# Patient Record
Sex: Female | Born: 1990 | Hispanic: Yes | Marital: Single | Smoking: Never smoker
Health system: Western US, Academic
[De-identification: ages and names within clinical notes are randomized; demographics above are authoritative.]

---

## 2018-06-16 ENCOUNTER — Ambulatory Visit: Payer: BC Managed Care – PPO | Attending: Internal Medicine | Admitting: Nurse Practitioner

## 2018-06-16 VITALS — BP 142/85 | HR 70 | Temp 98.1°F | Resp 16 | Ht 65.0 in | Wt 238.4 lb

## 2018-06-16 DIAGNOSIS — H0011 Chalazion right upper eyelid: Principal | ICD-10-CM | POA: Insufficient documentation

## 2018-06-16 DIAGNOSIS — H0014 Chalazion left upper eyelid: Secondary | ICD-10-CM | POA: Insufficient documentation

## 2018-06-16 DIAGNOSIS — Z6839 Body mass index (BMI) 39.0-39.9, adult: Secondary | ICD-10-CM | POA: Insufficient documentation

## 2018-06-16 MED ORDER — ERYTHROMYCIN 5 MG/GM OP OINT
1.00 | TOPICAL_OINTMENT | Freq: Once | OPHTHALMIC | 0 refills | Status: AC
Start: 2018-06-16 — End: 2018-06-16

## 2018-06-16 NOTE — Patient Instructions (Addendum)
Chalazion of RIGHT upper eyelid  -     erythromycin (ROMYCIN) 5 MG/GM ophthalmic ointment; Place 1 strip into right eye once for 1 dose x 7 days  - Avoid eye makeup, contact lenses use, or swimming  - Warm compress 3 times daily  -   Take medication as directed.   Follow up with PCP in 1  for unimproved symptoms.       Chalazion  A chalazion is a swelling or lump on the eyelid. It can affect the upper or lower eyelid.  What are the causes?  This condition may be caused by:   Long-lasting (chronic) inflammation of the eyelid glands.   A blocked oil gland in the eyelid.    What are the signs or symptoms?  Symptoms of this condition include:   A swelling on the eyelid. The swelling may spread to areas around the eye.   A hard lump on the eyelid. This lump may make it hard to see out of the eye.    How is this diagnosed?  This condition is diagnosed with an examination of the eye.  How is this treated?  This condition is treated by applying a warm compress to the eyelid. If the condition does not improve after two days, it may be treated with:   Surgery.   Medicine that is injected into the chalazion by a health care provider.   Medicine that is applied to the eye.    Follow these instructions at home:   Do not touch the chalazion.   Do not try to remove the pus, such as by squeezing the chalazion or sticking it with a pin or needle.   Do not rub your eyes.   Wash your hands often. Dry your hands with a clean towel.   Keep your face, scalp, and eyebrows clean.   Avoid wearing eye makeup.   Apply a warm, moist compress to the eyelid 4-6 times a day for 10-15 minutes at a time. This will help to open any blocked glands and help to reduce redness and swelling.   Apply over-the-counter and prescription medicines only as told by your health care provider.   If the chalazion does not break open (rupture) on its own in a month, return to your health care provider.   Keep all follow-up appointments as told by  your health care provider. This is important.  Contact a health care provider if:   Your eyelid has not improved in 4 weeks.   Your eyelid is getting worse.   You have a fever.   The chalazion does not rupture on its own with home treatment in a month.  Get help right away if:   You have pain in your eye.   Your vision changes.   The chalazion becomes painful or red   The chalazion gets bigger.  This information is not intended to replace advice given to you by your health care provider. Make sure you discuss any questions you have with your health care provider.  Document Released: 08/31/2000 Document Revised: 02/09/2016 Document Reviewed: 12/27/2014  Elsevier Interactive Patient Education  [employer].

## 2018-06-16 NOTE — Progress Notes (Signed)
 [facility]   [address] [place], [place] [address]   [phone]    [facility] INTERNAL MEDICINE     06/16/2018    CHIEF COMPLAINT:      Roberta Woods is a 27 year old female  presenting with right upper eyelid swollen and discomfort x 1 days. Denies fever/chills, vision changes, discharge to right eye. Denies headache or dizziness. Denies SOB or chest pain. Denies nausea or vomiting.    Patient's last menstrual period was 06/16/2018.    PAST MEDICAL HISTORY:   No past medical history on file.     PAST SURGICAL HISTORY:   No past surgical history on file.     ALLERGIES:   No Known Allergies     MEDICATIONS:   No current outpatient medications on file.     No current facility-administered medications for this visit.         FAMILY HISTORY:   No family history on file.       REVIEW OF SYSTEMS:   All other systems reviewed and were negative except as noted in HPI.      PHYSICAL EXAM:   BP 142/85 (BP Location: Right arm, BP Patient Position: Sitting, BP cuff size: Regular)   Pulse 70   Temp 98.1 F (36.7 C) (Oral)   Resp 16   Ht 5\' 5"  (1.651 m)   Wt 108.2 kg (238 lb 6.8 oz)   LMP 06/16/2018   BMI 39.68 kg/m      GEN: Well developed, well nourished, no acute distress  SKIN: No rashes, or visible lesions.  HENT:    Head: Normocephalic and atraumatic.    Eyes: Pupils are equal, round, and reactive to light. Conjunctivae and EOM are normal. Lids are everted and swept, no foreign bodies found. Right eye exhibits SMALL hordeolum TO RIGHT UPPER EYELID WITHOUT ULCERATION. Right eye exhibits no chemosis, no discharge and no exudate. No foreign body present in the right eye. No scleral icterus  NECK: Neck supple, no cervical LN noted, no jugular venous distention   CV: Regular rate rhythm, no murmurs, rubs, gallops  PULM: clear to auscultation bilaterally, no wheezes/rales/rhonchi  EXT:  Dorsalis pedis pulse +2 bilaterally. No cyanosis, clubbing or edema   NEURO:    Alert and oriented x3.  Sensation and motor are grossly intact.     ASSESSMENT/PLAN:     Roberta Woods was seen today for other.    Diagnoses and all orders for this visit:    Chalazion of RIGHT upper eyelid  -     erythromycin (ROMYCIN) 5 MG/GM ophthalmic ointment; Place 1 strip into right eye once for 1 dose x 7 days  - Avoid eye makeup, contact lenses use, or swimming  - Warm compress 3 times daily     Take medication as directed.   Follow up with PCP in 1  for unimproved symptoms.     Patient gives verbal permission for voicemail to be left with results and/or clinical information in instance that the patient does not pick up the phone.    Discussed findings with patient/family. Patient verbalized understanding and agreement, and all questions were answered.    Karie Fetch, NP

## 2024-03-14 IMAGING — MR CORPO ANSONIC^ABDOME TOTAL ANSONIC
23 series · 48 of 48 positions shown · IV contrast (C O N T R A S  T E)
Comparison: none

[Series 2: axial_t2_tse_pelve · axial · 5.5mm · 1.25mm/px · 1 of 24 slices shown]
[im 1/24]
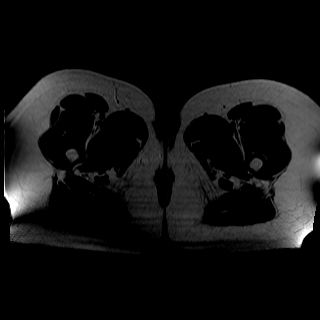

[Series 3: axial_t1_tse_pelve · axial · 5.5mm · 1.56mm/px · 1 of 24 slices shown]
[im 1/24]
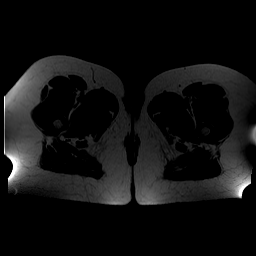

[Series 4: sag_t2_tse_utero · sagittal · 4.5mm · 0.94mm/px · 1 of 24 slices shown]
[im 1/24]
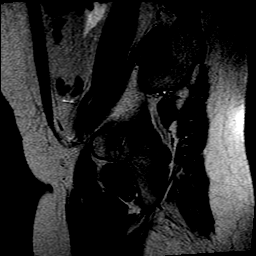

[Series 6: cor_t2_tse_utero · coronal · 4.0mm · 0.94mm/px · 1 of 16 slices shown]
[im 1/16]
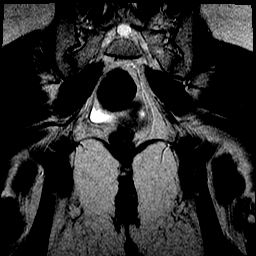

[Series 7: axial_t1_vibe_pelve_pre_apneia · axial · 4.0mm · 1.56mm/px · z∈[-75,+113]mm · 3 of 48 slices shown]
[im 1/48]
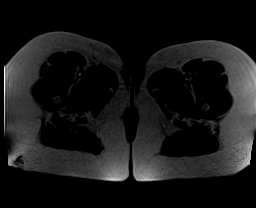
[im 24/48]
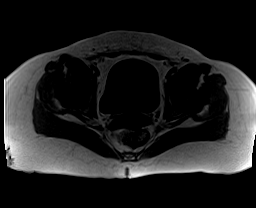
[im 48/48]
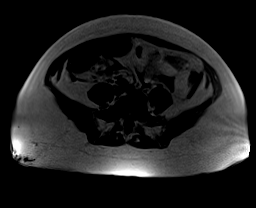

[Series 8: tra_t2_tse utero · oblique · 4.0mm · 0.94mm/px · 1 of 20 slices shown]
[im 1/20]
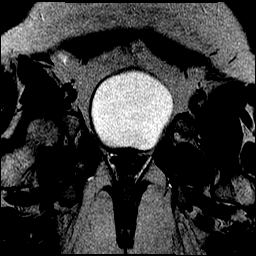

[Series 10: loc_truffi_sem apneia_superior · axial · 12.0mm · 1.56mm/px · 1 of 11 slices shown]
[im 1/11]
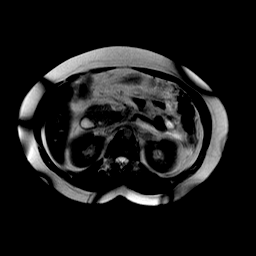

[Series 11: t2_blade_tra_trigger-resp · 1 of 8 slices shown]
[im 1/8]
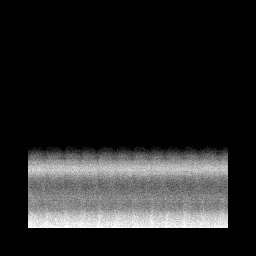

[Series 12: t2_blade_tra_trigger · axial · 5.0mm · 1.48mm/px · z∈[-125,+139]mm · 2 of 34 slices shown]
[im 1/34]
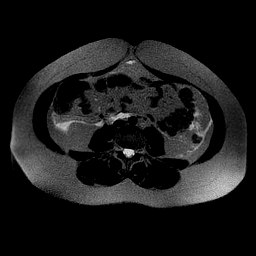
[im 34/34]
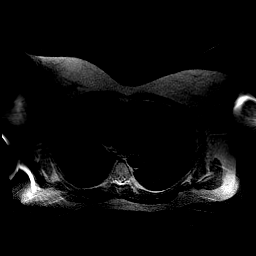

[Series 14: t2_blade_cor_trigger-resp · 1 of 11 slices shown]
[im 1/11]
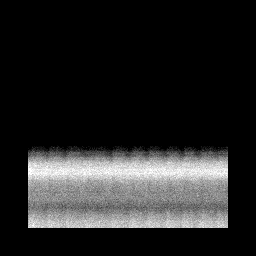

[Series 15: t2_blade_cor_trigger · coronal · 5.0mm · 1.48mm/px · 2 of 30 slices shown]
[im 1/30]
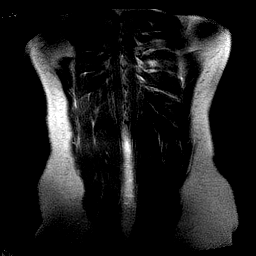
[im 30/30]
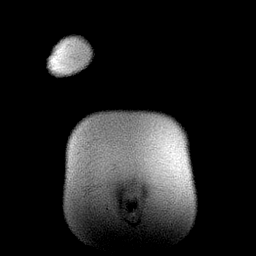

[Series 19: stir_blade_tra_trigger-resp · 1 of 13 slices shown]
[im 1/13]
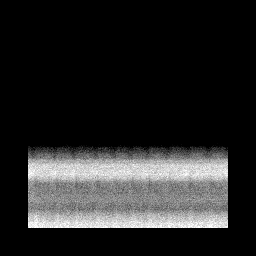

[Series 20: stir_blade_tra_trigger · axial · 5.0mm · 1.48mm/px · z∈[-125,+139]mm · 2 of 34 slices shown]
[im 1/34]
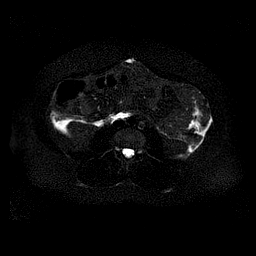
[im 34/34]
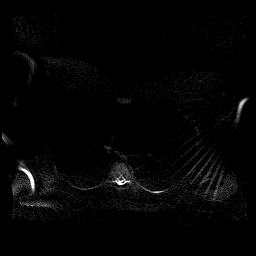

[Series 21: loc_truffi_com_apneia_superior · axial · 12.0mm · 1.56mm/px · 1 of 11 slices shown (1 of 2)]
[im 1/11]
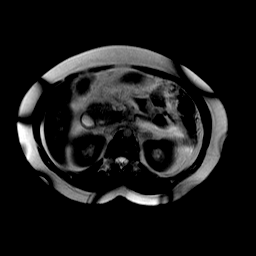

[Series 23: axial_t1_in_out_phase_superior · axial · 8.5mm · 1.19mm/px · z∈[-139,+129]mm · 3 of 44 slices shown]
[im 1/44]
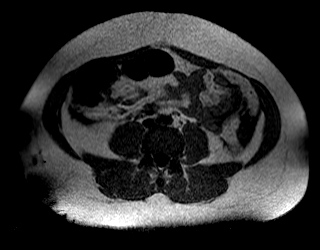
[im 22/44]
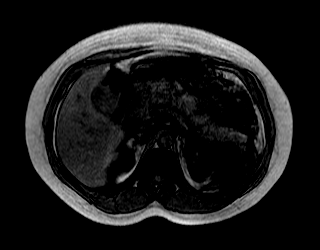
[im 44/44]
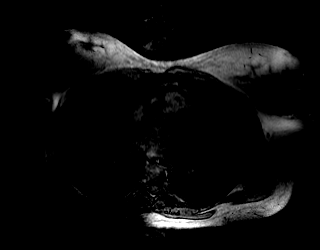

[Series 24: t1_(person_name)2(person_name)_cor_3_apneias · coronal · 8.0mm · 0.78mm/px · 1 of 20 slices shown]
[im 1/20]
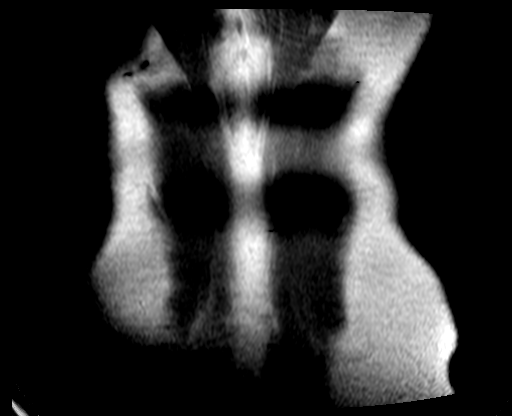

[Series 25: T1 · axial · 3.5mm · 1.48mm/px · z∈[-151,+98]mm · 4 of 72 slices shown (1 of 4)]
[im 1/72]
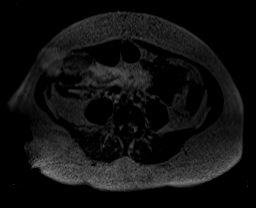
[im 24/72]
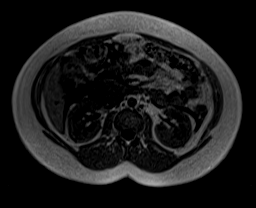
[im 48/72]
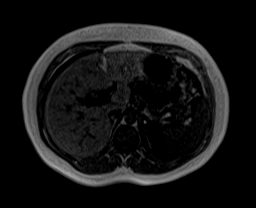
[im 72/72]
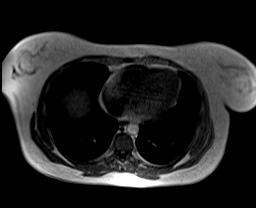

[Series 27: loc_truffi_com_apneia_superior · axial · 12.0mm · 1.56mm/px · 1 of 11 slices shown (2 of 2)]
[im 1/11]
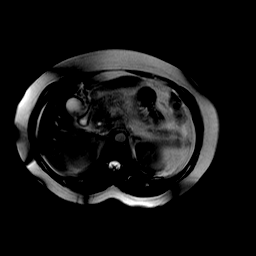

[Series 28: T1 · axial · 3.5mm · 1.48mm/px · z∈[-151,+126]mm · 5 of 80 slices shown (2 of 4)]
[im 1/80]
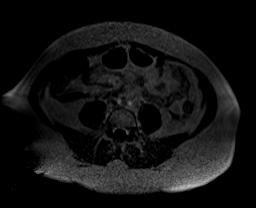
[im 20/80]
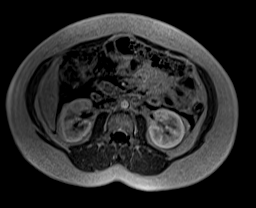
[im 40/80]
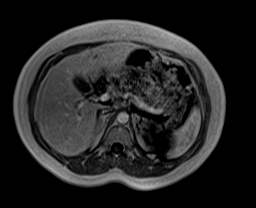
[im 60/80]
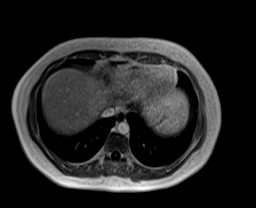
[im 80/80]
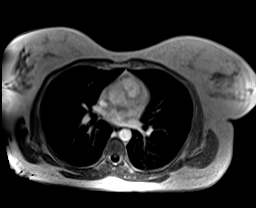

[Series 29: T1 · axial · 3.5mm · 1.48mm/px · z∈[-151,+126]mm · 5 of 80 slices shown (3 of 4)]
[im 1/80]
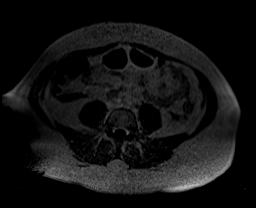
[im 20/80]
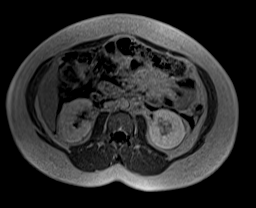
[im 40/80]
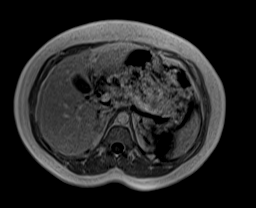
[im 60/80]
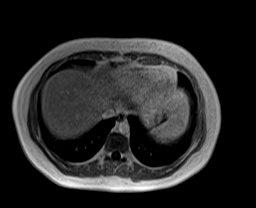
[im 80/80]
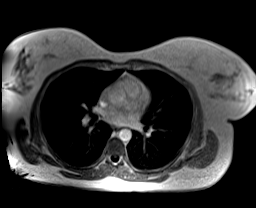

[Series 30: T1 · axial · 3.5mm · 1.48mm/px · z∈[-151,+126]mm · 5 of 80 slices shown (4 of 4)]
[im 1/80]
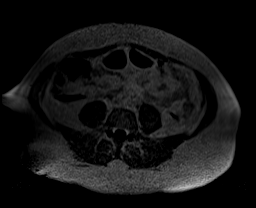
[im 20/80]
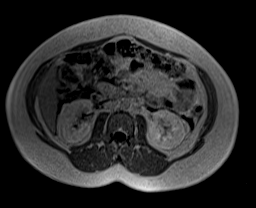
[im 40/80]
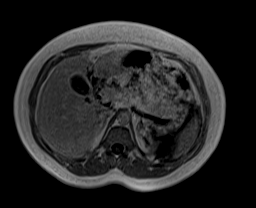
[im 60/80]
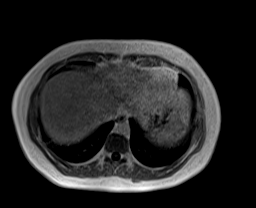
[im 80/80]
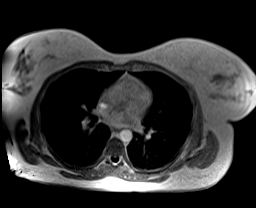

[Series 32: axial_t1_vibe_pelve_pos · axial · 4.0mm · 1.56mm/px · z∈[-60,+128]mm · 3 of 48 slices shown]
[im 1/48]
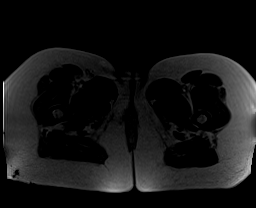
[im 24/48]
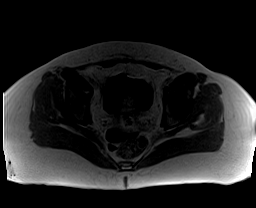
[im 48/48]
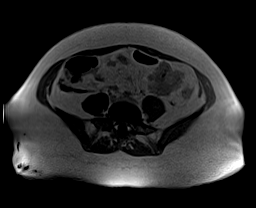

[Series 33: sag_t1_vibe_pelve_pos · sagittal · 4.0mm · 1.29mm/px · 2 of 36 slices shown]
[im 1/36]
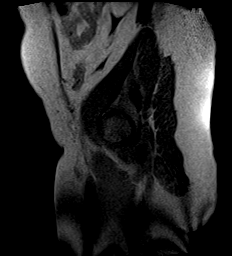
[im 36/36]
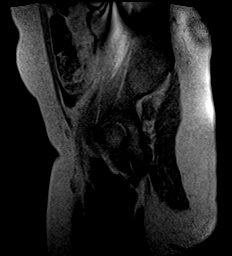

[48 of 48 positions shown; findings below may reference images not displayed]

RESSONÂNCIA MAGNÉTICA DE ABDOME TOTAL

TÉCNICA: 
Exame realizado em equipamento de ressonância magnética com sequências, ponderações e planos específicos para o segmento de interesse, antes e após a administração endovenosa do meio de contraste.

RESULTADO:
Planos musculares e estruturas ósseas com intensidade de sinal normal.
Fígado com forma, contornos, dimensões e intensidade de sinal normal, sem evidencias de lesões focais.
Veias porta e supra-hepáticas pérvias.
Não há dilatação da árvore biliar intra ou extra-hepática.
Vesícula biliar com forma e dimensões normais, sem evidências de cálculos no interior.
Baço sem alterações.
Pâncreas com forma normal e intensidade de sinal preservada.
Rins tópicos, com forma, dimensões e intensidade de sinal normal. Não há dilatação dos sistemas coletores. 
Adrenais identificadas com aspecto habitual. 
Alças intestinais sem sinais aparentes de alterações focais.
Ausência de líquido ascítico. 
Grandes vasos abdominais de calibres preservados, sem evidências de linfonodomegalias ao redor.
Bexiga com forma e contornos normais, de paredes finas e lisas.
Órgãos genitais identificados com forma, contornos, dimensões e intensidade de sinal normal.
Ausência de lesões expansivas ou linfonodomegalias na pelve.
Pequena quantidade de líquido livre na pelve.

CONCLUSÃO:
Ressonância magnética do abdome total normal.
